# Patient Record
Sex: Male | Born: 1985 | Race: Black or African American | Hispanic: No | Smoking: Current every day smoker
Health system: Southern US, Community
[De-identification: ages and names within clinical notes are randomized; demographics above are authoritative.]

---

## 2010-09-23 ENCOUNTER — Emergency Department (HOSPITAL_BASED_OUTPATIENT_CLINIC_OR_DEPARTMENT_OTHER)
Admission: EM | Admit: 2010-09-23 | Discharge: 2010-09-23 | Payer: Self-pay | Source: Home / Self Care | Admitting: Emergency Medicine

## 2010-12-01 LAB — URINE MICROSCOPIC-ADD ON

## 2010-12-01 LAB — URINALYSIS, ROUTINE W REFLEX MICROSCOPIC
Bilirubin Urine: NEGATIVE
Glucose, UA: NEGATIVE mg/dL
Hgb urine dipstick: NEGATIVE
Ketones, ur: NEGATIVE mg/dL
Nitrite: NEGATIVE
Protein, ur: NEGATIVE mg/dL
Specific Gravity, Urine: 1.03 (ref 1.005–1.030)
Urobilinogen, UA: 1 mg/dL (ref 0.0–1.0)
pH: 6 (ref 5.0–8.0)

## 2010-12-01 LAB — GC/CHLAMYDIA PROBE AMP, GENITAL
Chlamydia, DNA Probe: NEGATIVE
GC Probe Amp, Genital: NEGATIVE

## 2018-03-27 ENCOUNTER — Other Ambulatory Visit: Payer: Self-pay

## 2018-03-27 ENCOUNTER — Encounter (HOSPITAL_BASED_OUTPATIENT_CLINIC_OR_DEPARTMENT_OTHER): Payer: Self-pay | Admitting: *Deleted

## 2018-03-27 DIAGNOSIS — F17210 Nicotine dependence, cigarettes, uncomplicated: Secondary | ICD-10-CM | POA: Insufficient documentation

## 2018-03-27 DIAGNOSIS — L0231 Cutaneous abscess of buttock: Secondary | ICD-10-CM | POA: Insufficient documentation

## 2018-03-27 NOTE — ED Triage Notes (Signed)
Pt reports a boil to buttocks x 3 days. Has tried topical medication without relief

## 2018-03-28 ENCOUNTER — Emergency Department (HOSPITAL_BASED_OUTPATIENT_CLINIC_OR_DEPARTMENT_OTHER)
Admission: EM | Admit: 2018-03-28 | Discharge: 2018-03-28 | Disposition: A | Discharge status: Home / Self Care | Payer: Self-pay | Attending: Emergency Medicine | Admitting: Emergency Medicine

## 2018-03-28 DIAGNOSIS — L0231 Cutaneous abscess of buttock: Secondary | ICD-10-CM

## 2018-03-28 MED ORDER — LIDOCAINE-EPINEPHRINE (PF) 2 %-1:200000 IJ SOLN
INTRAMUSCULAR | Status: AC
  Administered 2018-03-28: 10 mL
  Filled 2018-03-28: qty 10

## 2018-03-28 NOTE — ED Provider Notes (Signed)
[facility] Provider Note: [name] Bridgett Hattabaugh, MD, FACEP  CSN: [id number] MRN: [id number] ARRIVAL: 03/27/18 at 2326 ROOM: MH02/MH02   CHIEF COMPLAINT  Abscess   HISTORY OF PRESENT ILLNESS  03/28/18 2:21 AM Jose Maldonado is a 32 y.o. male with a tender swollen area of his right medial buttock for the past 3 days.  The onset has been gradual.  He is having severe pain at the site, worse with movement or sitting.  He has noted purulent drainage from the site as well.  He has no history of abscesses in the past.  He was noted to have a low-grade fever on arrival.   History reviewed. No pertinent past medical history.  History reviewed. No pertinent surgical history.  No family history on file.  Social History   Tobacco Use  . Smoking status: Current Every Day Smoker    Types: Cigarettes  . Smokeless tobacco: Never Used  Substance Use Topics  . Alcohol use: Yes    Comment: occasional  . Drug use: Never    Prior to Admission medications   Not on File    Allergies Patient has no known allergies.   REVIEW OF SYSTEMS  Negative except as noted here or in the History of Present Illness.   PHYSICAL EXAMINATION  Initial Vital Signs Blood pressure 134/85, pulse (!) 56, temperature 100.1 F (37.8 C), temperature source Oral, resp. rate 18, height 6\' 1"  (1.854 m), weight 74.8 kg (165 lb), SpO2 98 %.  Examination General: Well-developed, well-nourished male in no acute distress; appearance consistent with age of record HENT: normocephalic; atraumatic Eyes: Normal appearance Neck: supple Heart: regular rate and rhythm Lungs: clear to auscultation bilaterally Abdomen: soft; nondistended Extremities: No deformity; full range of motion Neurologic: Awake, alert and oriented; motor function intact in all extremities and symmetric; no facial droop Skin: Warm and dry; tender, swollen lesion right medial buttock with purulent drainage consistent with  abscess Psychiatric: Normal mood and affect   RESULTS  Summary of this visit's results, reviewed by myself:   EKG Interpretation  Date/Time:    Ventricular Rate:    PR Interval:    QRS Duration:   QT Interval:    QTC Calculation:   R Axis:     Text Interpretation:        Laboratory Studies: No results found for this or any previous visit (from the past 24 hour(s)). Imaging Studies: No results found.  ED COURSE and MDM  Nursing notes and initial vitals signs, including pulse oximetry, reviewed.  Vitals:   03/27/18 2332 03/27/18 2334 03/28/18 0246  BP:  134/85 (!) 145/84  Pulse:  (!) 56 85  Resp:  18 16  Temp:  100.1 F (37.8 C) 99.5 F (37.5 C)  TempSrc:  Oral Oral  SpO2:  98% 100%  Weight: 74.8 kg (165 lb)    Height: 6\' 1"  (1.854 m)     Patient was advised to return if symptoms worse.  If symptoms are improving he was advised to remove his own packing in 2 to 3 days.  PROCEDURES   INCISION AND DRAINAGE Performed by: Paula LibraMOLPUS,Eria Lozoya L Consent: Verbal consent obtained. Risks and benefits: risks, benefits and alternatives were discussed Type: abscess  Body area: Right medial buttock  Anesthesia: local infiltration  Incision was made with a scalpel.  Local anesthetic: lidocaine 2 % with epinephrine  Anesthetic total: 3 ml  Complexity: complex Blunt dissection to break up loculations  Drainage: purulent  Drainage amount: Copious  Packing  material: 1/4 in iodoform gauze  Patient tolerance: Patient tolerated the procedure well with no immediate complications.   ED DIAGNOSES     ICD-10-CM   1. Abscess of buttock, right L02.31        [name], Jonny Ruiz, MD 03/28/18 [phone]

## 2019-11-27 ENCOUNTER — Emergency Department (HOSPITAL_BASED_OUTPATIENT_CLINIC_OR_DEPARTMENT_OTHER)
Admission: EM | Admit: 2019-11-27 | Discharge: 2019-11-27 | Disposition: A | Discharge status: Home / Self Care | Payer: BC Managed Care – PPO | Attending: Emergency Medicine | Admitting: Emergency Medicine

## 2019-11-27 ENCOUNTER — Other Ambulatory Visit: Payer: Self-pay

## 2019-11-27 ENCOUNTER — Encounter (HOSPITAL_BASED_OUTPATIENT_CLINIC_OR_DEPARTMENT_OTHER): Payer: Self-pay

## 2019-11-27 DIAGNOSIS — F17210 Nicotine dependence, cigarettes, uncomplicated: Secondary | ICD-10-CM | POA: Insufficient documentation

## 2019-11-27 DIAGNOSIS — R112 Nausea with vomiting, unspecified: Secondary | ICD-10-CM | POA: Diagnosis not present

## 2019-11-27 DIAGNOSIS — J3489 Other specified disorders of nose and nasal sinuses: Secondary | ICD-10-CM | POA: Diagnosis not present

## 2019-11-27 DIAGNOSIS — Z20822 Contact with and (suspected) exposure to covid-19: Secondary | ICD-10-CM | POA: Insufficient documentation

## 2019-11-27 DIAGNOSIS — R1111 Vomiting without nausea: Secondary | ICD-10-CM

## 2019-11-27 LAB — COMPREHENSIVE METABOLIC PANEL
ALT: 42 U/L (ref 0–44)
AST: 87 U/L — ABNORMAL HIGH (ref 15–41)
Albumin: 4.1 g/dL (ref 3.5–5.0)
Alkaline Phosphatase: 66 U/L (ref 38–126)
Anion gap: 13 (ref 5–15)
BUN: 22 mg/dL — ABNORMAL HIGH (ref 6–20)
CO2: 25 mmol/L (ref 22–32)
Calcium: 9.1 mg/dL (ref 8.9–10.3)
Chloride: 105 mmol/L (ref 98–111)
Creatinine, Ser: 1.34 mg/dL — ABNORMAL HIGH (ref 0.61–1.24)
GFR calc Af Amer: >60 mL/min (ref >60)
GFR calc non Af Amer: >60 mL/min (ref >60)
Glucose, Bld: 83 mg/dL (ref 70–99)
Potassium: 4 mmol/L (ref 3.5–5.1)
Sodium: 143 mmol/L (ref 135–145)
Total Bilirubin: 1.3 mg/dL — ABNORMAL HIGH (ref 0.3–1.2)
Total Protein: 6.9 g/dL (ref 6.5–8.1)

## 2019-11-27 LAB — URINALYSIS, ROUTINE W REFLEX MICROSCOPIC
Bilirubin Urine: NEGATIVE
Glucose, UA: NEGATIVE mg/dL
Hgb urine dipstick: NEGATIVE
Ketones, ur: >80 mg/dL — AB
Leukocytes,Ua: NEGATIVE
Nitrite: NEGATIVE
Protein, ur: NEGATIVE mg/dL
Specific Gravity, Urine: 1.025 (ref 1.005–1.030)
pH: 6 (ref 5.0–8.0)

## 2019-11-27 LAB — CBC WITH DIFFERENTIAL/PLATELET
Abs Immature Granulocytes: 0.1 10*3/uL — ABNORMAL HIGH (ref 0.00–0.07)
Basophils Absolute: 0 10*3/uL (ref 0.0–0.1)
Basophils Relative: 0 %
Eosinophils Absolute: 0 10*3/uL (ref 0.0–0.5)
Eosinophils Relative: 0 %
HCT: 42 % (ref 39.0–52.0)
Hemoglobin: 14 g/dL (ref 13.0–17.0)
Immature Granulocytes: 1 %
Lymphocytes Relative: 13 %
Lymphs Abs: 1.3 10*3/uL (ref 0.7–4.0)
MCH: 32 pg (ref 26.0–34.0)
MCHC: 33.3 g/dL (ref 30.0–36.0)
MCV: 96.1 fL (ref 80.0–100.0)
Monocytes Absolute: 0.3 10*3/uL (ref 0.1–1.0)
Monocytes Relative: 3 %
Neutro Abs: 8.5 10*3/uL — ABNORMAL HIGH (ref 1.7–7.7)
Neutrophils Relative %: 83 %
Platelets: 211 10*3/uL (ref 150–400)
RBC: 4.37 MIL/uL (ref 4.22–5.81)
RDW: 12.8 % (ref 11.5–15.5)
WBC: 10.2 10*3/uL (ref 4.0–10.5)
nRBC: 0 % (ref 0.0–0.2)

## 2019-11-27 LAB — CK: Total CK: 223 U/L (ref 49–397)

## 2019-11-27 LAB — LIPASE, BLOOD: Lipase: 18 U/L (ref 11–51)

## 2019-11-27 LAB — SARS CORONAVIRUS 2 (TAT 6-24 HRS): SARS Coronavirus 2: NEGATIVE

## 2019-11-27 MED ORDER — SODIUM CHLORIDE 0.9 % IV BOLUS
1000.0000 mL | Freq: Once | INTRAVENOUS | Status: AC
  Administered 2019-11-27: 1000 mL via INTRAVENOUS

## 2019-11-27 MED ORDER — ONDANSETRON HCL 4 MG/2ML IJ SOLN
4.0000 mg | Freq: Once | INTRAMUSCULAR | Status: AC
  Administered 2019-11-27: 4 mg via INTRAVENOUS
  Filled 2019-11-27: qty 2

## 2019-11-27 MED ORDER — ONDANSETRON 4 MG PO TBDP: 4.0000 mg | ORAL_TABLET | Freq: Three times a day (TID) | ORAL | 0 refills | Status: DC | PRN

## 2019-11-27 NOTE — ED Provider Notes (Signed)
[facility] EMERGENCY DEPARTMENT Provider Note   CSN: [id number] Arrival date & time: 11/27/19  2831     History Chief Complaint  Patient presents with  . Emesis    Jose Maldonado is a 34 y.o. male.  HPI 34 year old African-American male with no pertinent past medical history presents to the emergency department today for evaluation of vomiting.  Patient reports when he woke up at 5:00 this morning he reports several episodes of nonbloody bilious emesis.  Also reports dry heaving.  Patient denies any diarrhea.  Denies any abdominal pain.  No fevers or chills.  No recent sick contacts.  No recent travel.  No recent new foods.  No recent medications.  He has taken no medications at home for his symptoms prior to arrival.  Patient has no medical problems.  Patient states he feels dehydrated and just rundown.  Denies any marijuana use.    History reviewed. No pertinent past medical history.  There are no problems to display for this patient.   History reviewed. No pertinent surgical history.     No family history on file.  Social History   Tobacco Use  . Smoking status: Current Every Day Smoker    Packs/day: 0.50    Types: Cigarettes  . Smokeless tobacco: Never Used  Substance Use Topics  . Alcohol use: Yes    Comment: pt rpeorts 1-2 shots last night 11-26-19  . Drug use: Never    Home Medications Prior to Admission medications   Not on File    Allergies    Patient has no known allergies.  Review of Systems   Review of Systems  Constitutional: Negative for chills and fever.  HENT: Positive for rhinorrhea. Negative for congestion and sore throat.   Eyes: Negative for discharge.  Respiratory: Negative for cough and shortness of breath.   Cardiovascular: Negative for chest pain.  Gastrointestinal: Positive for nausea. Negative for abdominal pain, blood in stool, diarrhea and vomiting.  Genitourinary: Negative for dysuria.  Musculoskeletal: Negative for  myalgias.  Skin: Negative for color change.  Neurological: Negative for headaches.  Psychiatric/Behavioral: Negative for confusion.    Physical Exam Updated Vital Signs BP 130/87 (BP Location: Right Arm)   Pulse 69   Temp 97.6 F (36.4 C) (Oral)   Resp 16   Ht 6\' 1"  (1.854 m)   Wt 79.4 kg   SpO2 100%   BMI 23.09 kg/m   Physical Exam Vitals and nursing note reviewed.  Constitutional:      General: He is not in acute distress.    Appearance: He is well-developed. He is not ill-appearing or toxic-appearing.  HENT:     Head: Normocephalic and atraumatic.     Nose: Nose normal.     Mouth/Throat:     Mouth: Mucous membranes are moist.     Pharynx: Oropharynx is clear. No oropharyngeal exudate or posterior oropharyngeal erythema.  Eyes:     General: No scleral icterus.       Right eye: No discharge.        Left eye: No discharge.  Cardiovascular:     Rate and Rhythm: Normal rate and regular rhythm.     Pulses: Normal pulses.     Heart sounds: Normal heart sounds. No murmur. No friction rub. No gallop.   Pulmonary:     Effort: Pulmonary effort is normal. No respiratory distress.     Breath sounds: Normal breath sounds. No stridor. No wheezing, rhonchi or rales.  Chest:  Chest wall: No tenderness.  Abdominal:     General: Abdomen is flat. Bowel sounds are normal. There is no distension.     Palpations: Abdomen is soft. There is no mass.     Tenderness: There is no abdominal tenderness. There is no right CVA tenderness, left CVA tenderness, guarding or rebound.     Hernia: No hernia is present.  Musculoskeletal:        General: Normal range of motion.     Cervical back: Normal range of motion. No rigidity.  Lymphadenopathy:     Cervical: No cervical adenopathy.  Skin:    General: Skin is warm and dry.     Capillary Refill: Capillary refill takes less than 2 seconds.     Coloration: Skin is not pale.  Neurological:     Mental Status: He is alert.  Psychiatric:         Mood and Affect: Mood normal.        Behavior: Behavior normal.        Thought Content: Thought content normal.        Judgment: Judgment normal.     ED Results / Procedures / Treatments   Labs (all labs ordered are listed, but only abnormal results are displayed) Labs Reviewed  CBC WITH DIFFERENTIAL/PLATELET - Abnormal; Notable for the following components:      Result Value   Neutro Abs 8.5 (*)    Abs Immature Granulocytes 0.10 (*)    All other components within normal limits  COMPREHENSIVE METABOLIC PANEL - Abnormal; Notable for the following components:   BUN 22 (*)    Creatinine, Ser 1.34 (*)    AST 87 (*)    Total Bilirubin 1.3 (*)    All other components within normal limits  URINALYSIS, ROUTINE W REFLEX MICROSCOPIC - Abnormal; Notable for the following components:   Ketones, ur >80 (*)    All other components within normal limits  SARS CORONAVIRUS 2 (TAT 6-24 HRS)  LIPASE, BLOOD  CK    EKG None  Radiology No results found.  Procedures Procedures (including critical care time)  Medications Ordered in ED Medications  sodium chloride 0.9 % bolus 1,000 mL (1,000 mLs Intravenous New Bag/Given 11/27/19 1025)    ED Course  I have reviewed the triage vital signs and the nursing notes.  Pertinent labs & imaging results that were available during my care of the patient were reviewed by me and considered in my medical decision making (see chart for details).    MDM Rules/Calculators/A&P                      34 year old presents the ER for acute onset of nausea and vomiting this morning.  Denies any abdominal pain or diarrhea.  No known sick contacts.  Patient does appear dehydrated on exam.  Vital signs are reassuring.  Patient is afebrile.  No significant tachycardia appreciated.  No focal abdominal tenderness.  Bowel sounds are increased.  No peritoneal signs.  Labs show no leukocytosis.  Patient has a mild elevation in creatinine 1.34 unknown baseline.  This is  likely secondary to patient's profuse vomiting this morning.  Fluids given.  Mild elevation in AST of 87.  Bilirubin is 1.3.  No focal right upper quadrant abdominal pain.  UA shows ketones consistent with dehydration.  Normal CK.  COVID-19 test was negative.  Normal lipase.  Suspect viral gastritis.  Doubt cholangitis, choledocholithiasis, cholecystitis, pancreatitis, bowel obstruction, diverticulitis, appendicitis.  No indication  for advanced imaging at this time.  Patient given fluids and Zofran in the ER.  He has no intractable vomiting.  Tolerating p.o. fluids at this time.  Patient feels improved want to be discharged home which I feel is reasonable.  Discussed with him to have lab work rechecked in 1 week.  Discussed if his symptoms worsen or if he gets any new symptoms return to the ER.  Pt is hemodynamically stable, in NAD, & able to ambulate in the ED. Evaluation does not show pathology that would require ongoing emergent intervention or inpatient treatment. I explained the diagnosis to the patient. Pain has been managed & has no complaints prior to dc. Pt is comfortable with above plan and is stable for discharge at this time. All questions were answered prior to disposition. Strict return precautions for f/u to the ED were discussed. Encouraged follow up with PCP.  Final Clinical Impression(s) / ED Diagnoses Final diagnoses:  Intractable vomiting without nausea, unspecified vomiting type    Rx / DC Orders ED Discharge Orders         Ordered    ondansetron (ZOFRAN ODT) 4 MG disintegrating tablet  Every 8 hours PRN     11/27/19 1213           Wallace Keller 11/30/19 2031    Tegeler, Canary Brim, MD 12/01/19 [phone]

## 2019-11-27 NOTE — ED Notes (Signed)
Pt ambulated to RR unassisted but could not void

## 2019-11-27 NOTE — ED Triage Notes (Signed)
Pt arrives to ED with c/o NV starting around 5 am this morning reports about 6-7 episodes.

## 2019-11-27 NOTE — Discharge Instructions (Signed)
Have discussed your lab work findings with you.  Your liver enzymes and bilirubin are mildly elevated along with your kidney function.  This is likely secondary to dehydration however I will make sure that you have these rechecked in 2 days.  You can call your primary care doctor who have this rechecked.  You can take the Zofran at home for any nausea or vomiting.  Drink plenty of fluids to stay hydrated.  Please make sure that you return to the ER if you develop any fevers, bloody stools, abdominal pain or for any other reason.  You do have a COVID-19 test pending.  Please make sure that you follow-up on this test result.  The positive need be quarantining for 10 days since onset of symptoms today.

## 2020-09-05 ENCOUNTER — Emergency Department (HOSPITAL_COMMUNITY): Payer: BC Managed Care – PPO

## 2020-09-05 ENCOUNTER — Other Ambulatory Visit: Payer: Self-pay

## 2020-09-05 ENCOUNTER — Emergency Department (HOSPITAL_COMMUNITY)
Admission: EM | Admit: 2020-09-05 | Discharge: 2020-09-05 | Disposition: A | Discharge status: Home / Self Care | Payer: BC Managed Care – PPO | Attending: Emergency Medicine | Admitting: Emergency Medicine

## 2020-09-05 ENCOUNTER — Encounter (HOSPITAL_COMMUNITY): Payer: Self-pay | Admitting: *Deleted

## 2020-09-05 DIAGNOSIS — F17210 Nicotine dependence, cigarettes, uncomplicated: Secondary | ICD-10-CM | POA: Insufficient documentation

## 2020-09-05 DIAGNOSIS — N3001 Acute cystitis with hematuria: Secondary | ICD-10-CM

## 2020-09-05 DIAGNOSIS — R319 Hematuria, unspecified: Secondary | ICD-10-CM | POA: Diagnosis present

## 2020-09-05 LAB — URINALYSIS, ROUTINE W REFLEX MICROSCOPIC
Bilirubin Urine: NEGATIVE
Glucose, UA: NEGATIVE mg/dL
Ketones, ur: NEGATIVE mg/dL
Nitrite: NEGATIVE
Protein, ur: 100 mg/dL — AB
RBC / HPF: >50 RBC/hpf — ABNORMAL HIGH (ref 0–5)
Specific Gravity, Urine: 1.003 — ABNORMAL LOW (ref 1.005–1.030)
WBC, UA: >50 WBC/hpf — ABNORMAL HIGH (ref 0–5)
pH: 7 (ref 5.0–8.0)

## 2020-09-05 LAB — COMPREHENSIVE METABOLIC PANEL
ALT: 15 U/L (ref 0–44)
AST: 21 U/L (ref 15–41)
Albumin: 3.2 g/dL — ABNORMAL LOW (ref 3.5–5.0)
Alkaline Phosphatase: 62 U/L (ref 38–126)
Anion gap: 11 (ref 5–15)
BUN: 9 mg/dL (ref 6–20)
CO2: 24 mmol/L (ref 22–32)
Calcium: 9 mg/dL (ref 8.9–10.3)
Chloride: 104 mmol/L (ref 98–111)
Creatinine, Ser: 1.15 mg/dL (ref 0.61–1.24)
GFR, Estimated: >60 mL/min (ref >60)
Glucose, Bld: 100 mg/dL — ABNORMAL HIGH (ref 70–99)
Potassium: 4.2 mmol/L (ref 3.5–5.1)
Sodium: 139 mmol/L (ref 135–145)
Total Bilirubin: 1.4 mg/dL — ABNORMAL HIGH (ref 0.3–1.2)
Total Protein: 6 g/dL — ABNORMAL LOW (ref 6.5–8.1)

## 2020-09-05 LAB — CBC
HCT: 39.4 % (ref 39.0–52.0)
Hemoglobin: 13.6 g/dL (ref 13.0–17.0)
MCH: 32.7 pg (ref 26.0–34.0)
MCHC: 34.5 g/dL (ref 30.0–36.0)
MCV: 94.7 fL (ref 80.0–100.0)
Platelets: 198 K/uL (ref 150–400)
RBC: 4.16 MIL/uL — ABNORMAL LOW (ref 4.22–5.81)
RDW: 12.6 % (ref 11.5–15.5)
WBC: 12.4 K/uL — ABNORMAL HIGH (ref 4.0–10.5)
nRBC: 0 % (ref 0.0–0.2)

## 2020-09-05 LAB — HIV ANTIBODY (ROUTINE TESTING W REFLEX): HIV Screen 4th Generation wRfx: NONREACTIVE

## 2020-09-05 LAB — LIPASE, BLOOD: Lipase: 22 U/L (ref 11–51)

## 2020-09-05 MED ORDER — DOXYCYCLINE HYCLATE 100 MG PO TABS
100.0000 mg | ORAL_TABLET | Freq: Once | ORAL | Status: AC
  Administered 2020-09-05: 10:00:00 100 mg via ORAL
  Filled 2020-09-05: qty 1

## 2020-09-05 MED ORDER — CEFTRIAXONE SODIUM 500 MG IJ SOLR
500.0000 mg | Freq: Once | INTRAMUSCULAR | Status: AC
  Administered 2020-09-05: 500 mg via INTRAMUSCULAR
  Filled 2020-09-05: qty 500

## 2020-09-05 MED ORDER — DOXYCYCLINE HYCLATE 100 MG PO CAPS: 100.0000 mg | ORAL_CAPSULE | Freq: Two times a day (BID) | ORAL | 0 refills | Status: AC

## 2020-09-05 MED ORDER — LIDOCAINE HCL (PF) 1 % IJ SOLN
1.0000 mL | Freq: Once | INTRAMUSCULAR | Status: AC
  Administered 2020-09-05: 10:00:00 1 mL
  Filled 2020-09-05: qty 5

## 2020-09-05 NOTE — ED Triage Notes (Signed)
The pt is c/o bloody urine after the end of urination  .  He woke up up with this at 0300am  And has been voiding every 10-15 minutes since no pain anywhereelae

## 2020-09-05 NOTE — ED Provider Notes (Signed)
[facility] EMERGENCY DEPARTMENT Provider Note   CSN: [id number] Arrival date & time: 09/05/20  0600     History Chief Complaint  Patient presents with  . Hematuria    Jose Maldonado is a 34 y.o. male smoker, otherwise healthy no daily medication use.  Patient reports this morning around 3 AM he woke up and use the bathroom, initially his stream was normal colored urine but at the end he noticed some blood in his urine and a mild burning pain at the end of urination.  He reports this has continued since this morning with but only been present at the very end of his urination.  He also reports a mild suprapubic pressure sensation constant worse with urination improved with rest, pain does not radiate.  He denies similar pain in the past.  Denies fever/chills, fall/injury, chest pain/shortness of breath, upper abdominal pain, nausea/vomiting, diarrhea, testicular pain/swelling, penile discharge, rashes/lesions, concern for STI, rectal pain, sex with men or any additional concerns.  HPI     History reviewed. No pertinent past medical history.  There are no problems to display for this patient.   History reviewed. No pertinent surgical history.     No family history on file.  Social History   Tobacco Use  . Smoking status: Current Every Day Smoker    Packs/day: 0.50    Types: Cigarettes  . Smokeless tobacco: Never Used  Vaping Use  . Vaping Use: Never used  Substance Use Topics  . Alcohol use: Yes    Comment: pt rpeorts 1-2 shots last night 11-26-19  . Drug use: Never    Home Medications Prior to Admission medications   Medication Sig Start Date End Date Taking? Authorizing Provider  doxycycline (VIBRAMYCIN) 100 MG capsule Take 1 capsule (100 mg total) by mouth 2 (two) times daily for 7 days. 09/05/20 09/12/20  Harlene Salts A, PA-C  ondansetron (ZOFRAN ODT) 4 MG disintegrating tablet Take 1 tablet (4 mg total) by mouth every 8 (eight) hours as  needed for nausea or vomiting. 11/27/19   Rise Mu, PA-C    Allergies    Patient has no known allergies.  Review of Systems   Review of Systems Ten systems are reviewed and are negative for acute change except as noted in the HPI  Physical Exam Updated Vital Signs BP 131/81   Pulse 83   Temp 98.9 F (37.2 C) (Oral)   Resp 16   Ht 6\' 1"  (1.854 m)   Wt 79.4 kg   SpO2 98%   BMI 23.09 kg/m   Physical Exam Constitutional:      General: He is not in acute distress.    Appearance: Normal appearance. He is well-developed. He is not ill-appearing or diaphoretic.  HENT:     Head: Normocephalic and atraumatic.  Eyes:     General: Vision grossly intact. Gaze aligned appropriately.     Pupils: Pupils are equal, round, and reactive to light.  Neck:     Trachea: Trachea and phonation normal.  Pulmonary:     Effort: Pulmonary effort is normal. No respiratory distress.  Abdominal:     General: There is no distension.     Palpations: Abdomen is soft.     Tenderness: There is no abdominal tenderness. There is no right CVA tenderness, left CVA tenderness, guarding or rebound.  Genitourinary:    Comments: GU examination refused by patient. Musculoskeletal:        General: Normal range of motion.  Cervical back: Normal range of motion.  Skin:    General: Skin is warm and dry.  Neurological:     Mental Status: He is alert.     GCS: GCS eye subscore is 4. GCS verbal subscore is 5. GCS motor subscore is 6.     Comments: Speech is clear and goal oriented, follows commands Major Cranial nerves without deficit, no facial droop Moves extremities without ataxia, coordination intact  Psychiatric:        Behavior: Behavior normal.     ED Results / Procedures / Treatments   Labs (all labs ordered are listed, but only abnormal results are displayed) Labs Reviewed  COMPREHENSIVE METABOLIC PANEL - Abnormal; Notable for the following components:      Result Value   Glucose, Bld  100 (*)    Total Protein 6.0 (*)    Albumin 3.2 (*)    Total Bilirubin 1.4 (*)    All other components within normal limits  CBC - Abnormal; Notable for the following components:   WBC 12.4 (*)    RBC 4.16 (*)    All other components within normal limits  URINALYSIS, ROUTINE W REFLEX MICROSCOPIC - Abnormal; Notable for the following components:   Color, Urine AMBER (*)    Specific Gravity, Urine 1.003 (*)    Hgb urine dipstick LARGE (*)    Protein, ur 100 (*)    Leukocytes,Ua SMALL (*)    RBC / HPF >50 (*)    WBC, UA >50 (*)    Bacteria, UA RARE (*)    All other components within normal limits  URINE CULTURE  LIPASE, BLOOD  RPR  HIV ANTIBODY (ROUTINE TESTING W REFLEX)  GC/CHLAMYDIA PROBE AMP ([place]) NOT AT [facility]    EKG None  Radiology CT Renal Stone Study  Result Date: 09/05/2020 CLINICAL DATA:  Hematuria and urinary frequency. EXAM: CT ABDOMEN AND PELVIS WITHOUT CONTRAST TECHNIQUE: Multidetector CT imaging of the abdomen and pelvis was performed following the standard protocol without IV contrast. COMPARISON:  None. FINDINGS: Lower chest: Unremarkable. Hepatobiliary: Tiny hypodensities in the right liver are too small to characterize but most likely benign, probably cyst. There is no evidence for gallstones, gallbladder wall thickening, or pericholecystic fluid. Patient is noted to have partial intrahepatic gallbladder, potentially with subcapsular fundal positioning. No intrahepatic or extrahepatic biliary dilation. Pancreas: No focal mass lesion. No dilatation of the main duct. No intraparenchymal cyst. No peripancreatic edema. Spleen: No splenomegaly. No focal mass lesion. Adrenals/Urinary Tract: No adrenal nodule or mass. No stones are seen in either kidney or ureter. No bladder stones. No secondary changes in either kidney or ureter. Bladder is not fully distended but there may be some minimal circumferential bladder wall thickening. Stomach/Bowel: Stomach is unremarkable.  No gastric wall thickening. No evidence of outlet obstruction. Duodenum is normally positioned as is the ligament of Treitz. No small bowel wall thickening. No small bowel dilatation. The terminal ileum is normal. The appendix is best seen on coronal images and is unremarkable. No gross colonic mass. No colonic wall thickening. Vascular/Lymphatic: No abdominal aortic aneurysm. There is no gastrohepatic or hepatoduodenal ligament lymphadenopathy. No retroperitoneal or mesenteric lymphadenopathy. No pelvic sidewall lymphadenopathy. Reproductive: The prostate gland and seminal vesicles are unremarkable. Other: No intraperitoneal free fluid. Musculoskeletal: No worrisome lytic or sclerotic osseous abnormality. IMPRESSION: 1. No evidence for urinary stone disease. No secondary changes in either kidney or ureter. Although not definite, component of mild circumferential bladder wall thickening not excluded. No other findings  to account for hematuria and urinary frequency. 2. Partial intrahepatic gallbladder, potentially with fundal component being subcapsular. Electronically Signed   By: Kennith Center M.D.   On: 09/05/2020 09:07    Procedures Procedures (including critical care time)  Medications Ordered in ED Medications  cefTRIAXone (ROCEPHIN) injection 500 mg (500 mg Intramuscular Given 09/05/20 0950)  lidocaine (PF) (XYLOCAINE) 1 % injection 1 mL (1 mL Other Given 09/05/20 0950)  doxycycline (VIBRA-TABS) tablet 100 mg (100 mg Oral Given 09/05/20 0950)    ED Course  I have reviewed the triage vital signs and the nursing notes.  Pertinent labs & imaging results that were available during my care of the patient were reviewed by me and considered in my medical decision making (see chart for details).    MDM Rules/Calculators/A&P                         Additional history obtained from: 1. Nursing notes from this visit. 2. Review of electronic medical records.  No pertinent recent  visits. ----------------------------- I ordered, reviewed and interpreted labs which include: CBC shows no leukocytosis of 12.4, no anemia. Lipase within normal limits. CMP shows no emergent Electra derangement, AKI, emergent LFT elevations or gap. Urinalysis shows greater than 50 RBCs, greater than 50 WBCs, small leukocytes, sperm, protein and hemoglobin.  Nitrate negative.  No ketones or bilirubin.  CT Renal Stone Study:  IMPRESSION:  1. No evidence for urinary stone disease. No secondary changes in  either kidney or ureter. Although not definite, component of mild  circumferential bladder wall thickening not excluded. No other  findings to account for hematuria and urinary frequency.  2. Partial intrahepatic gallbladder, potentially with fundal  component being subcapsular.   HIV, RPR, GC chlamydia and urine culture pending. - Shared discussion made with patient, his girlfriend was asked to step out of the room during discussion.  Patient denies any concern for STI today but did agree to testing.  He denies any sex with males, rectal pain or other symptoms to suggest prostatitis.  Additionally he did refuse genitourinary exam citing no testicular pain swelling and no concerning lesions and he checked himself this morning.  Patient states understanding of limitations of examination today without GU exam.  Considering patient's age most likely source of urinary tract infection would be STI, patient is agreeable to treatment with Rocephin/doxycycline.  Patient is aware he will need to follow-up on his pending STI tests on his MyChart account in the next 2-3 days.  He is aware to avoid any sexual contact until he completes treatment and results of his test are available.  He is aware that all sexual partners will need to be notified and treated if tests are positive.  Based on patient's history low suspicion for epididymitis, pyelonephritis or other ascending infections at this time, additionally  doubt torsion or other emergent pathologies.  Patient's urine was sent for culture, he is aware that if it grows bacteria requiring treatment with different antibiotics he will be contacted with with a new prescription.  Considering patient is also a smoker or other etiologies of gross hematuria were considered discussed with patient that he should call [facility] today to schedule a follow-up appointment as if his hematuria does not resolve with treatment of UTI further testing may be necessary given increased risk for malignancy.   Discussed smoking cessation with patient and was they were offerred resources to help stop.  Total time was 5 min  CPT code [id number].   At this time there does not appear to be any evidence of an acute emergency medical condition and the patient appears stable for discharge with appropriate outpatient follow up. Diagnosis was discussed with patient who verbalizes understanding of care plan and is agreeable to discharge. I have discussed return precautions with patient who verbalizes understanding. Patient encouraged to follow-up with their PCP and Urology. All questions answered.  Patient's case discussed with Dr. Anitra LauthPlunkett who agrees with plan to discharge with Rocephin/doxycycline and urology follow-up.   Note: Portions of this report may have been transcribed using voice recognition software. Every effort was made to ensure accuracy; however, inadvertent computerized transcription errors may still be present. Final Clinical Impression(s) / ED Diagnoses Final diagnoses:  Acute cystitis with hematuria    Rx / DC Orders ED Discharge Orders         Ordered    doxycycline (VIBRAMYCIN) 100 MG capsule  2 times daily        09/05/20 [address]26           Lylia Karn A, PA-C 09/05/20 1029    Gwyneth SproutPlunkett, Whitney, MD 09/06/20 1322

## 2020-09-05 NOTE — Discharge Instructions (Addendum)
At this time there does not appear to be the presence of an emergent medical condition, however there is always the potential for conditions to change. Please read and follow the below instructions.  Please return to the Emergency Department immediately for any new or worsening symptoms or if your symptoms do not improve within 3 days. Please be sure to follow up with your Primary Care Provider within one week regarding your visit today; please call their office to schedule an appointment even if you are feeling better for a follow-up visit.  You may call the number under [facility] to establish a primary care provider if you do not already have 1. Please call the urologist Dr. Estil Daft office today to schedule follow-up appointment regarding the blood in your urine. Please attempt to stop smoking as this increases your risk of cancer. You have been started on treatment presumptively today for gonorrhea and chlamydia. You have been tested today for gonorrhea and chlamydia as well as HIV and syphilis. These results will be available in approximately 3 days. You may check your MyChart account for results. Please inform all sexual partners of positive results and that they should be tested and treated as well. Please wait 2 weeks and be sure that you and your partners are symptom free before returning to sexual activity. Please use protection with every sexual encounter. Your CT scan showed small hypodensities in your liver, intrahepatic gallbladder.  Please discuss these incidental findings with your primary care provider at your follow-up visit.  Go to the nearest Emergency Department immediately if: You have fever or chills You have very bad back pain. You have very bad pain in your lower belly. You are sick to your stomach (nauseous). You are throwing up. You have testicular pain or swelling You have any new/concerning or worsening of symptoms.   Please read the  additional information packets attached to your discharge summary.  Do not take your medicine if  develop an itchy rash, swelling in your mouth or lips, or difficulty breathing; call 911 and seek immediate emergency medical attention if this occurs.  You may review your lab tests and imaging results in their entirety on your MyChart account.  Please discuss all results of fully with your primary care provider and other specialist at your follow-up visit.  Note: Portions of this text may have been transcribed using voice recognition software. Every effort was made to ensure accuracy; however, inadvertent computerized transcription errors may still be present.

## 2020-09-06 LAB — URINE CULTURE: Culture: NO GROWTH

## 2020-09-06 LAB — SYPHILIS: RPR W/REFLEX TO RPR TITER AND TREPONEMAL ANTIBODIES, TRADITIONAL SCREENING AND DIAGNOSIS ALGORITHM: RPR Ser Ql: NONREACTIVE

## 2022-01-09 IMAGING — CT CT RENAL STONE PROTOCOL
2 of 4 series · 16 of 46 positions shown, 18 images · non-contrast
Comparison: None.

CLINICAL DATA: Hematuria and urinary frequency.

EXAM:
CT ABDOMEN AND PELVIS WITHOUT CONTRAST
TECHNIQUE: Multidetector CT imaging of the abdomen and pelvis was performed
following the standard protocol without IV contrast.

[Series 3: ap without · axial · non-contrast · 0.70mm/px · z∈[-440,-50]mm · 13 of 88 slices shown, 15 images]
[im 5/88  soft-tissue]
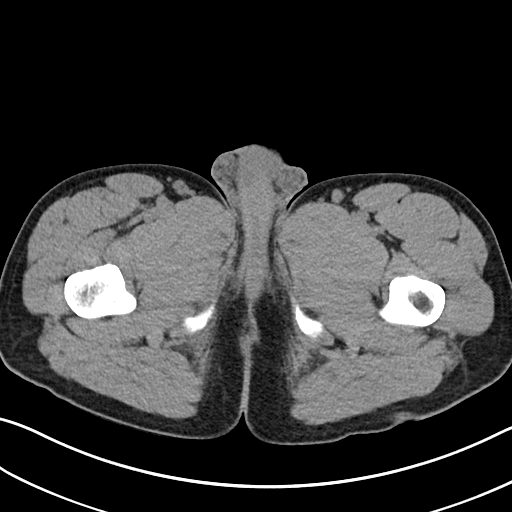
[im 5/88  bone]
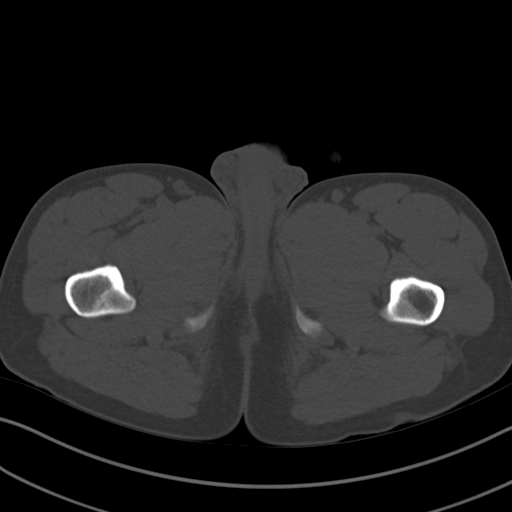
[im 14/88  soft-tissue]
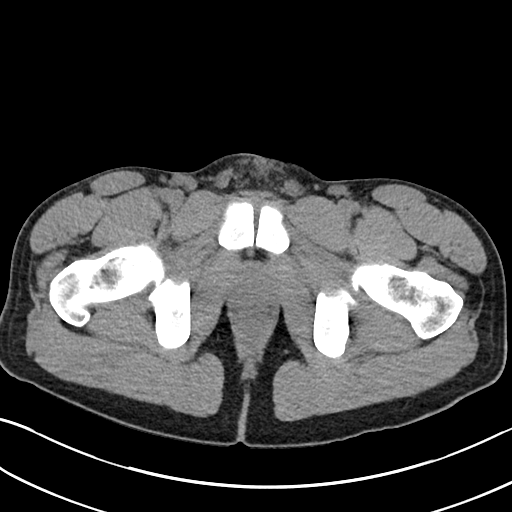
[im 19/88  soft-tissue]
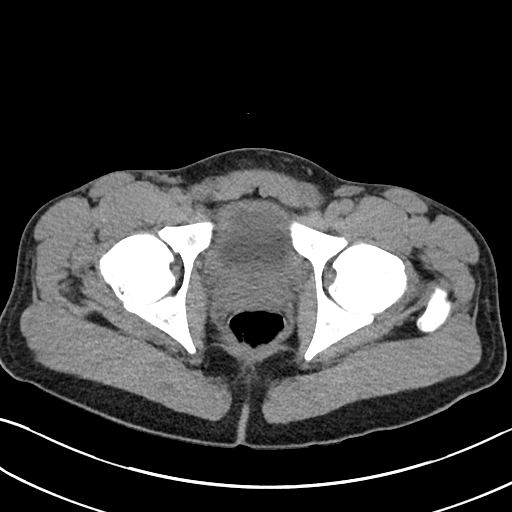
[im 23/88  soft-tissue]
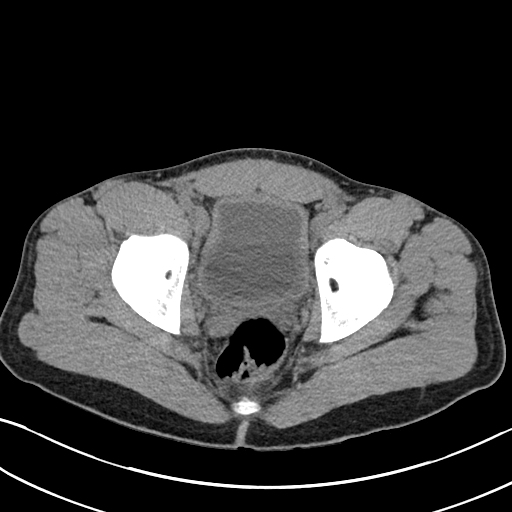
[im 33/88  soft-tissue]
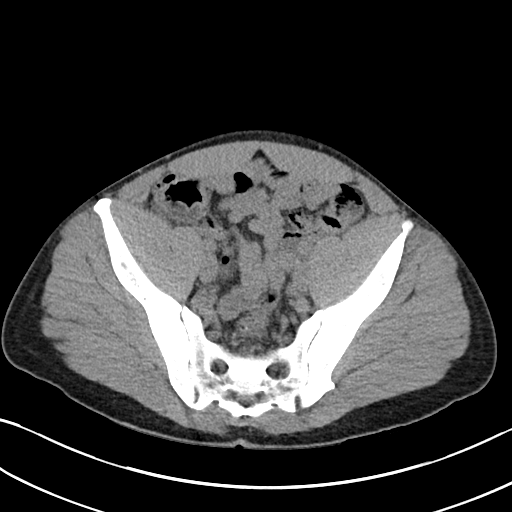
[im 37/88  soft-tissue]
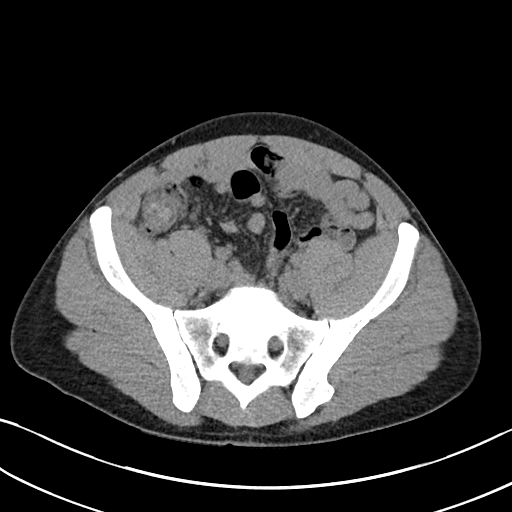
[im 46/88  soft-tissue]
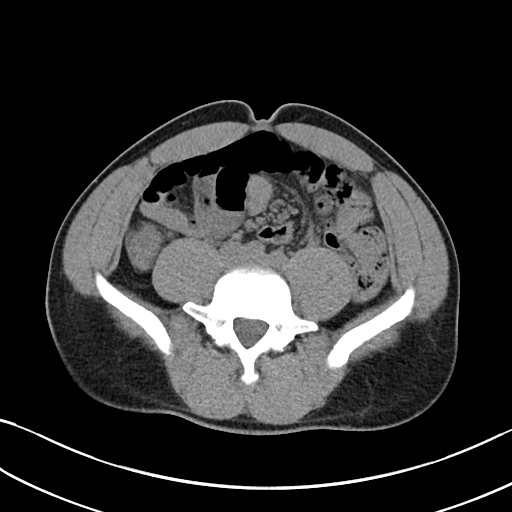
[im 51/88  soft-tissue]
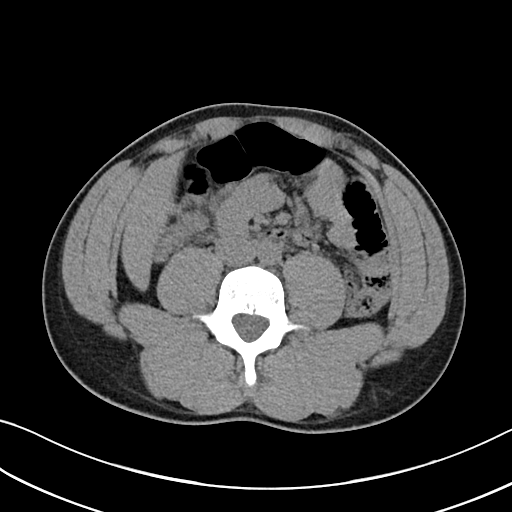
[im 55/88  soft-tissue]
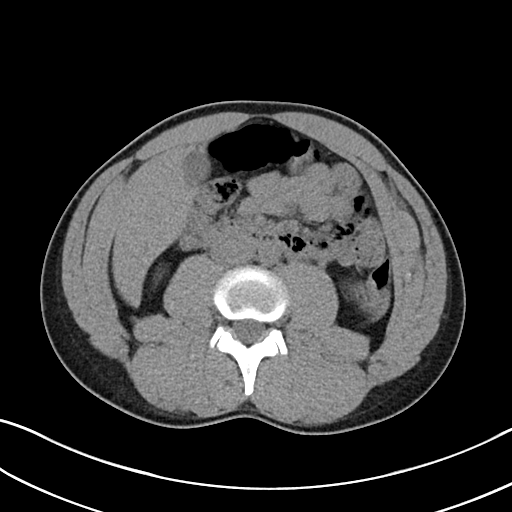
[im 55/88  bone]
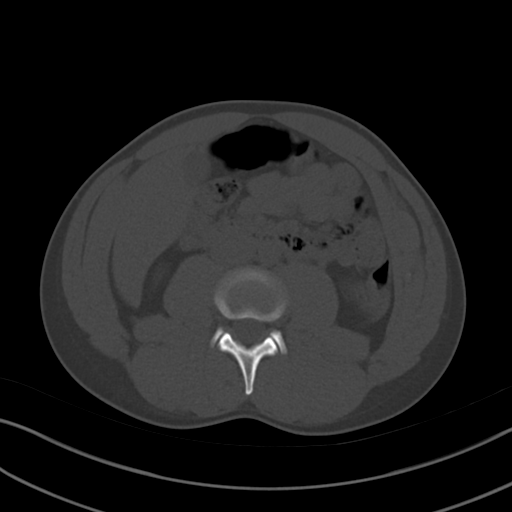
[im 65/88  soft-tissue]
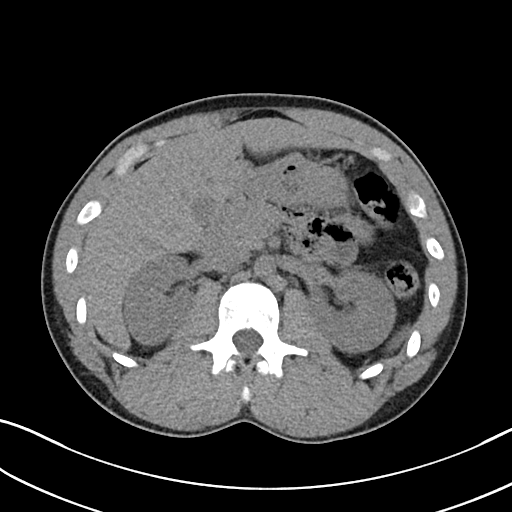
[im 69/88  soft-tissue]
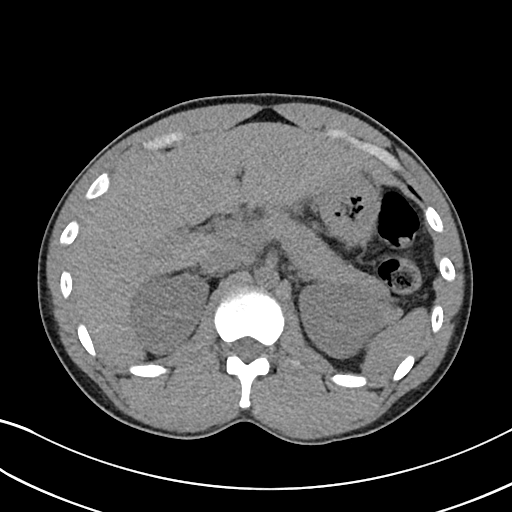
[im 74/88  soft-tissue]
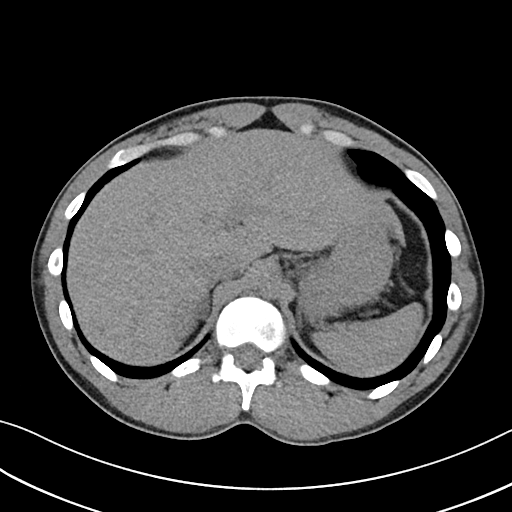
[im 83/88  soft-tissue]
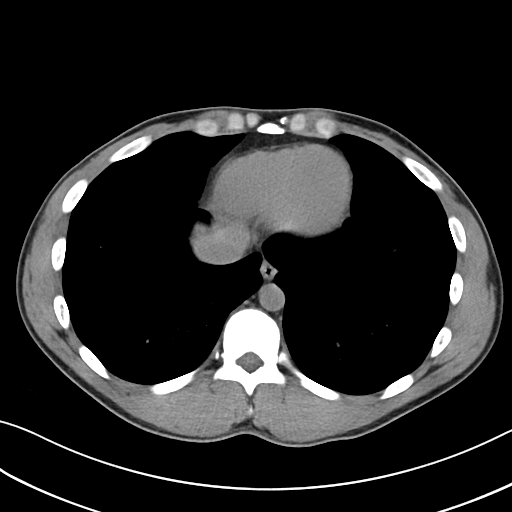

[Series 6: cor · coronal · 0.75mm/px · 3 of 87 slices shown]
[im 29/87  soft-tissue]
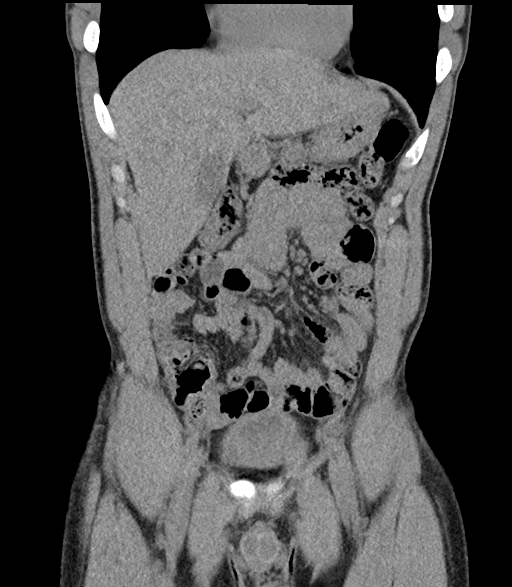
[im 39/87  soft-tissue]
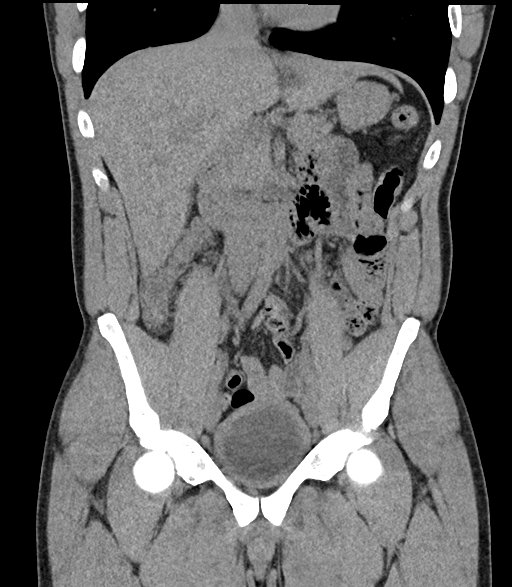
[im 48/87  soft-tissue]
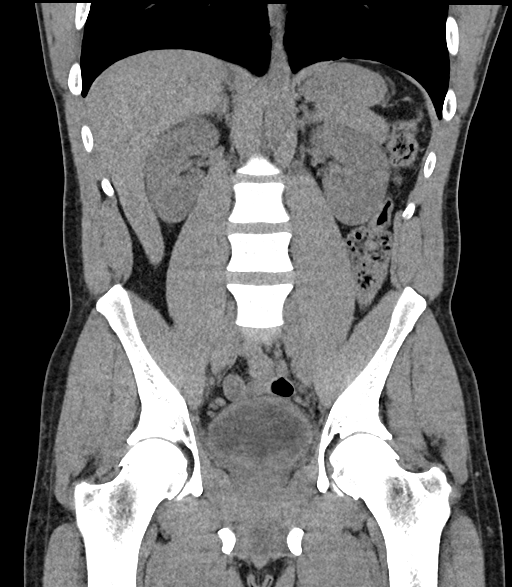

[16 of 46 positions shown; findings below may reference images not displayed]

FINDINGS: Lower chest: Unremarkable.

Hepatobiliary: Tiny hypodensities in the right liver are too small
to characterize but most likely benign, probably cyst. There is no
evidence for gallstones, gallbladder wall thickening, or
pericholecystic fluid. Patient is noted to have partial intrahepatic
gallbladder, potentially with subcapsular fundal positioning. No
intrahepatic or extrahepatic biliary dilation.

Pancreas: No focal mass lesion. No dilatation of the main duct. No
intraparenchymal cyst. No peripancreatic edema.

Spleen: No splenomegaly. No focal mass lesion.

Adrenals/Urinary Tract: No adrenal nodule or mass. No stones are
seen in either kidney or ureter. No bladder stones. No secondary
changes in either kidney or ureter. Bladder is not fully distended
but there may be some minimal circumferential bladder wall
thickening.

Stomach/Bowel: Stomach is unremarkable. No gastric wall thickening.
No evidence of outlet obstruction. Duodenum is normally positioned
as is the ligament of Treitz. No small bowel wall thickening. No
small bowel dilatation. The terminal ileum is normal. The appendix
is best seen on coronal images and is unremarkable. No gross colonic
mass. No colonic wall thickening.

Vascular/Lymphatic: No abdominal aortic aneurysm. There is no
gastrohepatic or hepatoduodenal ligament lymphadenopathy. No
retroperitoneal or mesenteric lymphadenopathy. No pelvic sidewall
lymphadenopathy.

Reproductive: The prostate gland and seminal vesicles are
unremarkable.

Other: No intraperitoneal free fluid.

Musculoskeletal: No worrisome lytic or sclerotic osseous
abnormality.
IMPRESSION: 1. No evidence for urinary stone disease. No secondary changes in
either kidney or ureter. Although not definite, component of mild
circumferential bladder wall thickening not excluded. No other
findings to account for hematuria and urinary frequency.
2. Partial intrahepatic gallbladder, potentially with fundal
component being subcapsular.

## 2022-01-26 ENCOUNTER — Encounter (HOSPITAL_COMMUNITY): Payer: Self-pay | Admitting: Emergency Medicine

## 2022-01-26 ENCOUNTER — Emergency Department (HOSPITAL_COMMUNITY)
Admission: EM | Admit: 2022-01-26 | Discharge: 2022-01-27 | Disposition: A | Discharge status: Home / Self Care | Payer: 59 | Attending: Emergency Medicine | Admitting: Emergency Medicine

## 2022-01-26 ENCOUNTER — Other Ambulatory Visit: Payer: Self-pay

## 2022-01-26 DIAGNOSIS — R6884 Jaw pain: Secondary | ICD-10-CM | POA: Insufficient documentation

## 2022-01-26 DIAGNOSIS — R001 Bradycardia, unspecified: Secondary | ICD-10-CM | POA: Diagnosis not present

## 2022-01-26 DIAGNOSIS — H938X1 Other specified disorders of right ear: Secondary | ICD-10-CM | POA: Insufficient documentation

## 2022-01-26 DIAGNOSIS — H9201 Otalgia, right ear: Secondary | ICD-10-CM

## 2022-01-26 MED ORDER — KETOROLAC TROMETHAMINE 60 MG/2ML IM SOLN
60.0000 mg | Freq: Once | INTRAMUSCULAR | Status: AC
  Administered 2022-01-27: 60 mg via INTRAMUSCULAR
  Filled 2022-01-26: qty 2

## 2022-01-26 MED ORDER — AMOXICILLIN-POT CLAVULANATE 875-125 MG PO TABS: 1.0000 | ORAL_TABLET | Freq: Two times a day (BID) | ORAL | 0 refills | Status: DC

## 2022-01-26 NOTE — ED Triage Notes (Addendum)
Pt reported to ED with c/o right sided ear pain x 3 days. States he has been taking OTC pain  medication with no relief. Denies any cough, sore throat or nasal drainage/congestion. Pt also states he is unsure if pain is coming from tooth because it radiates into jaw. Denies any dental carries or dental issues.  ?

## 2022-01-26 NOTE — ED Provider Notes (Signed)
?[facility] EMERGENCY DEPARTMENT ?Provider Note ? ? ?CSN: [id number] ?Arrival date & time: 01/26/22  2109 ? ?  ? ?History ? ?Chief Complaint  ?Patient presents with  ? Otalgia  ? ? ?Jose Maldonado is a 36 y.o. male. ? ?HPI ?36 year old male with no significant medical history per our EMR presents to the ER with complaints of right-sided jaw/ear pain began on Thursday.  Patient reports lower ear pain/jaw pain which has progressively gotten worse.  No known fevers or chills.  He denies any tooth pain.  Denies any difficulty breathing or swallowing.  No known fevers.  Denies any chest pain.  He has been taking ibuprofen and oxycodone with little relief. He does follow with a dentist but does not have a PCP ?  ? ?Home Medications ?Prior to Admission medications   ?Medication Sig Start Date End Date Taking? Authorizing Provider  ?amoxicillin-clavulanate (AUGMENTIN) 875-125 MG tablet Take 1 tablet by mouth every 12 (twelve) hours. 01/27/22   Mare Ferrari, PA-C  ?HYDROcodone-acetaminophen (NORCO/VICODIN) 5-325 MG tablet Take 1 tablet by mouth every 4 (four) hours as needed for up to 3 days. 01/27/22 01/30/22  Mare Ferrari, PA-C  ?ondansetron (ZOFRAN ODT) 4 MG disintegrating tablet Take 1 tablet (4 mg total) by mouth every 8 (eight) hours as needed for nausea or vomiting. 11/27/19   Rise Mu, PA-C  ?   ? ?Allergies    ?Patient has no known allergies.   ? ?Review of Systems   ?Review of Systems ?Ten systems reviewed and are negative for acute change, except as noted in the HPI.  ? ?Physical Exam ?Updated Vital Signs ?BP (!) 185/110   Pulse 66   Temp 99.5 ?F (37.5 ?C) (Oral)   Resp 18   SpO2 98%  ?Physical Exam ?Vitals and nursing note reviewed.  ?Constitutional:   ?   General: He is not in acute distress. ?   Appearance: He is well-developed.  ?HENT:  ?   Head: Normocephalic and atraumatic.  ? ?   Comments: Mild tenderness to palpation over right upper jaw, no fluctuance, no overlying skin  changes, no drainage, no warmth.  Full range of motion of jaw. ?   Ears:  ?   Comments: TM with mild bulging but no significant erythema, no visible drainage.  No tragal tenderness.  No mastoid tenderness. ?   Mouth/Throat:  ? ?   Comments: No evidence of swelling, abscess, poor dentition throughout including in the right lower jaw.  No visible tongue swelling, no sublingual swelling.  No significant tenderness to palpation over right lower or right upper teeth ?Eyes:  ?   Conjunctiva/sclera: Conjunctivae normal.  ?Cardiovascular:  ?   Rate and Rhythm: Normal rate and regular rhythm.  ?   Heart sounds: No murmur heard. ?Pulmonary:  ?   Effort: Pulmonary effort is normal. No respiratory distress.  ?   Breath sounds: Normal breath sounds.  ?Abdominal:  ?   Palpations: Abdomen is soft.  ?   Tenderness: There is no abdominal tenderness.  ?Musculoskeletal:     ?   General: No swelling.  ?   Cervical back: Neck supple.  ?Skin: ?   General: Skin is warm and dry.  ?   Capillary Refill: Capillary refill takes less than 2 seconds.  ?Neurological:  ?   Mental Status: He is alert.  ?Psychiatric:     ?   Mood and Affect: Mood normal.  ? ? ?ED Results / Procedures /  Treatments   ?Labs ?(all labs ordered are listed, but only abnormal results are displayed) ?Labs Reviewed - No data to display ? ?EKG ?EKG Interpretation ? ?Date/Time:  Tuesday Jan 27 2022 00:25:04 EDT ?Ventricular Rate:  57 ?PR Interval:  104 ?QRS Duration: 100 ?QT Interval:  412 ?QTC Calculation: 401 ?R Axis:   72 ?Text Interpretation: Sinus bradycardia with short PR Nonspecific ST abnormality Abnormal ECG No previous ECGs available Confirmed by Ross Marcus ([id number]) on 01/27/2022 12:42:22 AM ? ?Radiology ?No results found. ? ?Procedures ?Procedures  ? ? ?Medications Ordered in ED ?Medications  ?ketorolac (TORADOL) injection 60 mg (60 mg Intramuscular Given 01/27/22 0026)  ?HYDROcodone-acetaminophen (NORCO/VICODIN) 5-325 MG per tablet 1 tablet (1 tablet Oral Given  01/27/22 0021)  ? ? ?ED Course/ Medical Decision Making/ A&P ?  ?                        ?Medical Decision Making ?Risk ?Prescription drug management. ? ?36 year old male who presents to the ER with complaints of right-sided jaw/ear pain x4 days.  On arrival, he is well-appearing does appear uncomfortable.  He is slightly hypertensive with a blood pressure 185/110 but afebrile, not tachycardic, tachypneic or hypoxic.  He is tolerating his secretions well.  His physical exam shows a mild right ear effusion but no significant erythema or drainage.  No tragal tenderness.  He has some tenderness to palpation to the right lower jaw but no evidence of fluctuance, erythema or warmth.  He does have evidence of poor dentition throughout including the right lower molars with evidence of dental caries.   He has no evidence of dental abscess.  EKG is nonischemic.  He was given Toradol and Norco for pain.  Unclear source of his pain, though given how uncomfortable he appears, I believe his pain is more of a odontogenic source. Will treat with Augmentin as this will cover possible otitis media and dental infection.  I recommended following up with his dentist/PCP.  He was understanding and is agreeable. Stable for discharge  ? ?Final Clinical Impression(s) / ED Diagnoses ?Final diagnoses:  ?Jaw pain  ? ? ?Rx / DC Orders ?ED Discharge Orders   ? ?      Ordered  ?  amoxicillin-clavulanate (AUGMENTIN) 875-125 MG tablet  Every 12 hours       ? 01/27/22 0045  ?  HYDROcodone-acetaminophen (NORCO/VICODIN) 5-325 MG tablet  Every 4 hours PRN,   Status:  Discontinued       ? 01/27/22 0045  ?  HYDROcodone-acetaminophen (NORCO/VICODIN) 5-325 MG tablet  Every 4 hours PRN       ? 01/27/22 0046  ?  amoxicillin-clavulanate (AUGMENTIN) 875-125 MG tablet  Every 12 hours,   Status:  Discontinued       ? 01/26/22 2345  ? ?  ?  ? ?  ? ? ?  ?Mare Ferrari, PA-C ?01/27/22 [id number] ? ?  ?Shon Baton, MD ?01/27/22 0132 ? ?

## 2022-01-27 MED ORDER — AMOXICILLIN-POT CLAVULANATE 875-125 MG PO TABS: 1.0000 | ORAL_TABLET | Freq: Two times a day (BID) | ORAL | 0 refills | Status: DC

## 2022-01-27 MED ORDER — HYDROCODONE-ACETAMINOPHEN 5-325 MG PO TABS: 1.0000 | ORAL_TABLET | ORAL | 0 refills | Status: AC | PRN

## 2022-01-27 MED ORDER — HYDROCODONE-ACETAMINOPHEN 5-325 MG PO TABS
1.0000 | ORAL_TABLET | Freq: Once | ORAL | Status: AC
  Administered 2022-01-27: 1 via ORAL
  Filled 2022-01-27: qty 1

## 2022-01-27 MED ORDER — HYDROCODONE-ACETAMINOPHEN 5-325 MG PO TABS: 1.0000 | ORAL_TABLET | ORAL | 0 refills | Status: DC | PRN

## 2022-01-27 NOTE — Discharge Instructions (Addendum)
You were evaluated in the Emergency Department and after careful evaluation, we did not find any emergent condition requiring admission or further testing in the hospital. ? ?Take antibiotic as directed until finished.  Take 800 mg of ibuprofen up to 3 times daily, use the Norco for breakthrough pain.  Please make sure to follow-up with your dentist. ? ?Please return to the Emergency Department if you experience any worsening of your condition.  We encourage you to follow up with a primary care provider.  Thank you for allowing [place] to be a part of your care. ? ?

## 2022-12-01 ENCOUNTER — Telehealth: Payer: Managed Care, Other (non HMO) | Admitting: Physician Assistant

## 2022-12-01 DIAGNOSIS — A084 Viral intestinal infection, unspecified: Secondary | ICD-10-CM

## 2022-12-01 MED ORDER — ONDANSETRON 4 MG PO TBDP: 4.0000 mg | ORAL_TABLET | Freq: Three times a day (TID) | ORAL | 0 refills | Status: AC | PRN

## 2022-12-01 NOTE — Addendum Note (Signed)
Addended by: Brunetta Jeans on: 12/01/2022 07:28 PM   Modules accepted: Orders

## 2022-12-01 NOTE — Patient Instructions (Addendum)
Josafat Rosana Hoes, thank you for joining Leeanne Rio, PA-C for today's virtual visit.  While this provider is not your primary care provider (PCP), if your PCP is located in our provider database this encounter information will be shared with them immediately following your visit.   [facility] account gives you access to today's visit and all your visits, tests, and labs performed at [facility] " click here if you don't have a [facility] account or go to [email or web address]  Consent: (Patient) Pravin Schmuck provided verbal consent for this virtual visit at the beginning of the encounter.  Current Medications:  Current Outpatient Medications:    amoxicillin-clavulanate (AUGMENTIN) 875-125 MG tablet, Take 1 tablet by mouth every 12 (twelve) hours., Disp: 14 tablet, Rfl: 0   ondansetron (ZOFRAN ODT) 4 MG disintegrating tablet, Take 1 tablet (4 mg total) by mouth every 8 (eight) hours as needed for nausea or vomiting., Disp: 8 tablet, Rfl: 0   Medications ordered in this encounter:  No orders of the defined types were placed in this encounter.    *If you need refills on other medications prior to your next appointment, please contact your pharmacy*  Follow-Up: Call back or seek an in-person evaluation if the symptoms worsen or if the condition fails to improve as anticipated.  [place] [phone]  Other Instructions Please keep well-hydrated and try to get plenty of rest. Follow the dietary recommendations below, reintroducing foods as you feel up to it. Take the Zofran as directed, as needed for nausea. Avoid use of any NSAIDs (Advil, Motrin, Aleve, Goody powders, BC powders) You can use Tylenol if needed. Symptoms should resolve over the next couple of days. If not, or if you note any new or worsening symptoms, please seek an in person evaluation ASAP.   To avoid any episodes of reflux in the future, avoid late night  eating.  Follow dietary recommendations below.  If this happens to be a recurring thing for you, I recommend an in person evaluation.  Bland Diet A bland diet may consist of soft foods or foods that are not high in fat or are not greasy, acidic, or spicy. Avoiding certain foods may cause less irritation to your mouth, throat, stomach, or gastrointestinal tract. Avoiding certain foods may make you feel better. Everyone's tolerances are different. A bland diet should be based on what you can tolerate and what may cause discomfort. What is my plan? Your health care provider or dietitian may recommend specific changes to your diet to treat your symptoms. These changes may include: Eating small meals frequently. Cooking food until it is soft enough to chew easily. Taking the time to chew your food thoroughly, so it is easy to swallow and digest. Avoiding foods that cause you discomfort. These may include spicy food, fried food, greasy foods, hard-to-chew foods, or citrus fruits and juices. Drinking slowly. What are tips for following this plan? Reading food labels To reduce fiber intake, look for food labels that say "whole," such as whole wheat or whole grain. Shopping Avoid food items that may have nuts or seeds. Avoid vegetables that may make you gassy or have a tough texture, such as broccoli, cauliflower, or corn. Cooking Cook foods thoroughly so they have a soft texture. Meal planning Make sure you include foods from all food groups to eat a balanced diet. Eat a variety of types of foods. Eat foods and drink beverages that do not cause you discomfort. These may  include soups and broths with cooked meats, pasta, and vegetables. Lifestyle Sit up after meals, avoid tight clothing, and take time to eat and chew your food slowly. Ask your health care provider whether you should take dietary supplements. General information Mildly season your foods. Some seasonings, such as cayenne pepper,  vinegar, or hot sauce, may cause irritation. The foods, beverages, or seasonings to avoid should be based on individual tolerance. What foods should I eat? Fruits Canned or cooked fruit such as peaches, pears, or applesauce. Bananas. Vegetables Well-cooked vegetables. Canned or cooked vegetables such as carrots, green beans, beets, or spinach. Mashed or boiled potatoes. Grains  Hot cereals, such as cream of wheat and processed oatmeal. Rice. Bread, crackers, pasta, or tortillas made from refined white flour. Meats and other proteins  Eggs. Creamy peanut butter or other nut butters. Lean, well-cooked tender meats, such as beef, pork, chicken, or fish. Dairy Low-fat dairy products such as milk, cottage cheese, or yogurt. Beverages  Water. Herbal tea. Apple juice. Fats and oils Mild salad dressings. Canola or olive oil. Sweets and desserts Low-fat pudding, custard, or ice cream. Fruit gelatin. The items listed above may not be a complete list of foods and beverages you can eat. Contact a dietitian for more information. What foods should I avoid? Fruits Citrus fruits, such as oranges and grapefruit. Fruits with a stringy texture. Fruits that have lots of seeds, such as kiwi or strawberries. Dried fruits. Vegetables Raw, uncooked vegetables. Salads. Grains Whole grain breads, muffins, and cereals. Meats and other proteins Tough, fibrous meats. Highly seasoned meat such as corned beef, smoked meats, or fish. Processed high-fat meats such as brats, hot dogs, or sausage. Dairy Full-fat dairy foods such as ice cream and cheese. Beverages Caffeinated drinks. Alcohol. Seasonings and condiments Strongly flavored seasonings or condiments. Hot sauce. Salsa. Other foods Spicy foods. Fried or greasy foods. Sour foods, such as pickled or fermented foods like sauerkraut. Foods high in fiber. The items listed above may not be a complete list of foods and beverages you should avoid. Contact a  dietitian for more information. Summary A bland diet should be based on individual tolerance. It may consist of foods that are soft textured and do not have a lot of fat, fiber, acid, or seasonings. A bland diet may be recommended because avoiding certain foods, beverages, or spices may make you feel better. This information is not intended to replace advice given to you by your health care provider. Make sure you discuss any questions you have with your health care provider. Document Revised: 07/28/2021 Document Reviewed: 07/28/2021 Elsevier Patient Education  [place] for Gastroesophageal Reflux Disease, Adult When you have gastroesophageal reflux disease (GERD), the foods you eat and your eating habits are very important. Choosing the right foods can help ease your discomfort. Think about working with a food expert (dietitian) to help you make good choices. What are tips for following this plan? Reading food labels Look for foods that are low in saturated fat. Foods that may help with your symptoms include: Foods that have less than 5% of daily value (DV) of fat. Foods that have 0 grams of trans fat. Cooking Do not fry your food. Cook your food by baking, steaming, grilling, or broiling. These are all methods that do not need a lot of fat for cooking. To add flavor, try to use herbs that are low in spice and acidity. Meal planning  Choose healthy foods that are low in fat,  such as: Fruits and vegetables. Whole grains. Low-fat dairy products. Lean meats, fish, and poultry. Eat small meals often instead of eating 3 large meals each day. Eat your meals slowly in a place where you are relaxed. Avoid bending over or lying down until 2-3 hours after eating. Limit high-fat foods such as fatty meats or fried foods. Limit your intake of fatty foods, such as oils, butter, and shortening. Avoid the following as told by your doctor: Foods that cause symptoms. These may  be different for different people. Keep a food diary to keep track of foods that cause symptoms. Alcohol. Drinking a lot of liquid with meals. Eating meals during the 2-3 hours before bed. Lifestyle Stay at a healthy weight. Ask your doctor what weight is healthy for you. If you need to lose weight, work with your doctor to do so safely. Exercise for at least 30 minutes on 5 or more days each week, or as told by your doctor. Wear loose-fitting clothes. Do not smoke or use any products that contain nicotine or tobacco. If you need help quitting, ask your doctor. Sleep with the head of your bed higher than your feet. Use a wedge under the mattress or blocks under the bed frame to raise the head of the bed. Chew sugar-free gum after meals. What foods should eat?  Eat a healthy, well-balanced diet of fruits, vegetables, whole grains, low-fat dairy products, lean meats, fish, and poultry. Each person is different. Foods that may cause symptoms in one person may not cause any symptoms in another person. Work with your doctor to find foods that are safe for you. The items listed above may not be a complete list of what you can eat and drink. Contact a food expert for more options. What foods should I avoid? Limiting some of these foods may help in managing the symptoms of GERD. Everyone is different. Talk with a food expert or your doctor to help you find the exact foods to avoid, if any. Fruits Any fruits prepared with added fat. Any fruits that cause symptoms. For some people, this may include citrus fruits, such as oranges, grapefruit, pineapple, and lemons. Vegetables Deep-fried vegetables. [place] fries. Any vegetables prepared with added fat. Any vegetables that cause symptoms. For some people, this may include tomatoes and tomato products, chili peppers, onions and garlic, and horseradish. Grains Pastries or quick breads with added fat. Meats and other proteins High-fat meats, such as fatty  beef or pork, hot dogs, ribs, ham, sausage, salami, and bacon. Fried meat or protein, including fried fish and fried chicken. Nuts and nut butters, in large amounts. Dairy Whole milk and chocolate milk. Sour cream. Cream. Ice cream. Cream cheese. Milkshakes. Fats and oils Butter. Margarine. Shortening. Ghee. Beverages Coffee and tea, with or without caffeine. Carbonated beverages. Sodas. Energy drinks. Fruit juice made with acidic fruits, such as orange or grapefruit. Tomato juice. Alcoholic drinks. Sweets and desserts Chocolate and cocoa. Donuts. Seasonings and condiments Pepper. Peppermint and spearmint. Added salt. Any condiments, herbs, or seasonings that cause symptoms. For some people, this may include curry, hot sauce, or vinegar-based salad dressings. The items listed above may not be a complete list of what you should not eat and drink. Contact a food expert for more options. Questions to ask your doctor Diet and lifestyle changes are often the first steps that are taken to manage symptoms of GERD. If diet and lifestyle changes do not help, talk with your doctor about taking medicines. Where to  find more information [employer] for Gastrointestinal Disorders: aboutgerd.org Summary When you have GERD, food and lifestyle choices are very important in easing your symptoms. Eat small meals often instead of 3 large meals a day. Eat your meals slowly and in a place where you are relaxed. Avoid bending over or lying down until 2-3 hours after eating. Limit high-fat foods such as fatty meats or fried foods. This information is not intended to replace advice given to you by your health care provider. Make sure you discuss any questions you have with your health care provider. Document Revised: 03/18/2020 Document Reviewed: 03/18/2020 Elsevier Patient Education  [place].    If you have been instructed to have an in-person evaluation today at a local Urgent Care  facility, please use the link below. It will take you to a list of all of our available [facility], including address, phone number and hours of operation. Please do not delay care.  [facility]  If you or a family member do not have a primary care provider, use the link below to schedule a visit and establish care. When you choose a [facility] primary care physician or advanced practice provider, you gain a long-term partner in health. Find a Primary Care Provider  Learn more about [facility]'s in-office and virtual care options: [facility]

## 2022-12-01 NOTE — Progress Notes (Signed)
Virtual Visit Consent   Jose Maldonado, you are scheduled for a virtual visit with a [facility] provider today. Just as with appointments in the office, your consent must be obtained to participate. Your consent will be active for this visit and any virtual visit you may have with one of our providers in the next 365 days. If you have a MyChart account, a copy of this consent can be sent to you electronically.  As this is a virtual visit, video technology does not allow for your provider to perform a traditional examination. This may limit your provider's ability to fully assess your condition. If your provider identifies any concerns that need to be evaluated in person or the need to arrange testing (such as labs, EKG, etc.), we will make arrangements to do so. Although advances in technology are sophisticated, we cannot ensure that it will always work on either your end or our end. If the connection with a video visit is poor, the visit may have to be switched to a telephone visit. With either a video or telephone visit, we are not always able to ensure that we have a secure connection.  By engaging in this virtual visit, you consent to the provision of healthcare and authorize for your insurance to be billed (if applicable) for the services provided during this visit. Depending on your insurance coverage, you may receive a charge related to this service.  I need to obtain your verbal consent now. Are you willing to proceed with your visit today? Jose Maldonado has provided verbal consent on 12/01/2022 for a virtual visit (video or telephone). Jose Maldonado, [place]  Date: 12/01/2022 7:00 PM  Virtual Visit via Video Note   I, Jose Maldonado, connected with  Jose Maldonado  ([id number], 1986/01/27) on 12/01/22 at  6:45 PM EDT by a video-enabled telemedicine application and verified that I am speaking with the correct person using two identifiers.  Location: Patient: Virtual Visit  Location Patient: Home Provider: Virtual Visit Location Provider: Home Office   I discussed the limitations of evaluation and management by telemedicine and the availability of in person appointments. The patient expressed understanding and agreed to proceed.    History of Present Illness: Jose Maldonado is a 37 y.o. who identifies as a male who was assigned male at birth, and is being seen today for GI symptoms.  Endorses having an episode about 3 weeks ago occurring in the evening where he had some stomach discomfort associated with heartburn and indigestion.  Lasted for around the day before resolving.  Has been completely fine up until yesterday.  Notes overnight he developed some mild abdominal cramping.  On waking this morning he noted nausea and chills.  Had 2 episodes of nonbloody emesis while at work.  Had another episode when he got home.  None since but residual nausea.  Notes some generalized abdominal cramping.  Denies heartburn or indigestion.  Denies melena, hematochezia or tenesmus.  Denies any increased bowel frequency but stools are loose.  Has not eaten much since this morning.  Of note his son was sick with similar symptoms over the weekend, finally feeling better.  Denies any recent alcohol consumption or NSAID use.   HPI: HPI  Problems: There are no problems to display for this patient.   Allergies: No Known Allergies Medications: No current outpatient medications on file.  Observations/Objective: Patient is well-developed, well-nourished in no acute distress.  Resting comfortably at home.  Head is normocephalic, atraumatic.  No labored  breathing. Speech is clear and coherent with logical content.  Patient is alert and oriented at baseline.   Assessment and Plan: 1. Viral gastroenteritis  Current episode seems most consistent with a viral gastroenteritis given known exposure and symptoms of alarm symptoms.  Supportive measures and OTC medications reviewed.  Zofran per  orders.  Start brat diet.  Strict in person follow-up precautions discussed with patient.  Work note provided.  Regarding his prior episode, this seems like a mild case of gastritis.  Denies any recurring symptoms or intermittent episodes of heartburn.  Will have him limit alcohol consumption and avoid NSAID use.  Avoid late night eating.  He is to monitor for any recurrence.  If this occurs, he needs an in person evaluation.  Follow Up Instructions: I discussed the assessment and treatment plan with the patient. The patient was provided an opportunity to ask questions and all were answered. The patient agreed with the plan and demonstrated an understanding of the instructions.  A copy of instructions were sent to the patient via MyChart unless otherwise noted below.   The patient was advised to call back or seek an in-person evaluation if the symptoms worsen or if the condition fails to improve as anticipated.  Time:  I spent 12 minutes with the patient via telehealth technology discussing the above problems/concerns.    Jose Rio, PA-C
